# Patient Record
Sex: Female | Born: 1935 | Race: White | Hispanic: No | Marital: Married
Health system: Southern US, Community
[De-identification: ages and names within clinical notes are randomized; demographics above are authoritative.]

---

## 2004-07-29 ENCOUNTER — Ambulatory Visit: Payer: Self-pay | Admitting: Oncology

## 2004-09-04 ENCOUNTER — Ambulatory Visit: Payer: Self-pay | Admitting: Unknown Physician Specialty

## 2005-01-29 ENCOUNTER — Ambulatory Visit: Payer: Self-pay | Admitting: Oncology

## 2005-02-04 ENCOUNTER — Ambulatory Visit: Payer: Self-pay | Admitting: Oncology

## 2005-02-14 ENCOUNTER — Ambulatory Visit: Payer: Self-pay | Admitting: Internal Medicine

## 2005-08-11 ENCOUNTER — Ambulatory Visit: Payer: Self-pay | Admitting: Oncology

## 2005-09-04 ENCOUNTER — Ambulatory Visit: Payer: Self-pay | Admitting: Oncology

## 2006-02-26 ENCOUNTER — Ambulatory Visit: Payer: Self-pay | Admitting: Internal Medicine

## 2006-03-04 ENCOUNTER — Ambulatory Visit: Payer: Self-pay | Admitting: Internal Medicine

## 2006-03-12 ENCOUNTER — Ambulatory Visit: Payer: Self-pay | Admitting: Oncology

## 2006-04-06 ENCOUNTER — Ambulatory Visit: Payer: Self-pay | Admitting: Oncology

## 2006-09-09 ENCOUNTER — Ambulatory Visit: Payer: Self-pay | Admitting: Internal Medicine

## 2006-12-06 ENCOUNTER — Ambulatory Visit: Payer: Self-pay | Admitting: Oncology

## 2006-12-17 ENCOUNTER — Ambulatory Visit: Payer: Self-pay | Admitting: Oncology

## 2007-01-05 ENCOUNTER — Ambulatory Visit: Payer: Self-pay | Admitting: Oncology

## 2007-06-23 ENCOUNTER — Ambulatory Visit: Payer: Self-pay | Admitting: Oncology

## 2007-07-08 ENCOUNTER — Ambulatory Visit: Payer: Self-pay | Admitting: Oncology

## 2007-07-16 ENCOUNTER — Ambulatory Visit: Payer: Self-pay | Admitting: Oncology

## 2007-08-08 ENCOUNTER — Ambulatory Visit: Payer: Self-pay | Admitting: Oncology

## 2007-12-22 ENCOUNTER — Ambulatory Visit: Payer: Self-pay | Admitting: Oncology

## 2008-01-10 ENCOUNTER — Ambulatory Visit: Payer: Self-pay | Admitting: Oncology

## 2008-02-05 ENCOUNTER — Ambulatory Visit: Payer: Self-pay | Admitting: Oncology

## 2008-11-08 ENCOUNTER — Ambulatory Visit: Payer: Self-pay | Admitting: Internal Medicine

## 2008-11-14 ENCOUNTER — Ambulatory Visit: Payer: Self-pay | Admitting: Internal Medicine

## 2008-12-25 ENCOUNTER — Ambulatory Visit: Payer: Self-pay | Admitting: Internal Medicine

## 2010-01-30 ENCOUNTER — Ambulatory Visit: Payer: Self-pay | Admitting: Internal Medicine

## 2010-08-24 ENCOUNTER — Emergency Department: Payer: Self-pay | Admitting: Emergency Medicine

## 2011-02-03 ENCOUNTER — Ambulatory Visit: Payer: Self-pay | Admitting: Internal Medicine

## 2011-09-09 ENCOUNTER — Ambulatory Visit: Payer: Self-pay | Admitting: Internal Medicine

## 2011-09-09 LAB — CREATININE, SERUM: EGFR (Non-African Amer.): >60

## 2012-03-25 ENCOUNTER — Ambulatory Visit: Payer: Self-pay | Admitting: Internal Medicine

## 2012-10-09 ENCOUNTER — Observation Stay: Payer: Self-pay | Admitting: Internal Medicine

## 2012-10-09 LAB — COMPREHENSIVE METABOLIC PANEL
Albumin: 3.6 g/dL (ref 3.4–5.0)
Alkaline Phosphatase: 68 U/L (ref 50–136)
Anion Gap: 6 — ABNORMAL LOW (ref 7–16)
BUN: 10 mg/dL (ref 7–18)
Bilirubin,Total: 0.2 mg/dL (ref 0.2–1.0)
Calcium, Total: 8.6 mg/dL (ref 8.5–10.1)
Chloride: 100 mmol/L (ref 98–107)
Co2: 27 mmol/L (ref 21–32)
EGFR (African American): >60
EGFR (Non-African Amer.): >60
Glucose: 193 mg/dL — ABNORMAL HIGH (ref 65–99)
Osmolality: 271 (ref 275–301)
Potassium: 4 mmol/L (ref 3.5–5.1)
SGPT (ALT): 21 U/L (ref 12–78)

## 2012-10-09 LAB — CBC
HCT: 34.9 % — ABNORMAL LOW (ref 35.0–47.0)
HGB: 11.9 g/dL — ABNORMAL LOW (ref 12.0–16.0)
MCH: 31.3 pg (ref 26.0–34.0)
MCV: 92 fL (ref 80–100)
RBC: 3.81 10*6/uL (ref 3.80–5.20)
WBC: 6.4 10*3/uL (ref 3.6–11.0)

## 2012-10-09 LAB — URINALYSIS, COMPLETE
Bacteria: NONE SEEN
Bilirubin,UR: NEGATIVE
Blood: NEGATIVE
Glucose,UR: NEGATIVE mg/dL (ref 0–75)
Ph: 5 (ref 4.5–8.0)
Protein: NEGATIVE
RBC,UR: <1 /HPF (ref 0–5)
Squamous Epithelial: <1

## 2012-10-09 LAB — PROTIME-INR: Prothrombin Time: 12.9 secs (ref 11.5–14.7)

## 2012-10-09 LAB — CK TOTAL AND CKMB (NOT AT ARMC)
CK, Total: 140 U/L (ref 21–215)
CK-MB: 2.4 ng/mL (ref 0.5–3.6)

## 2012-10-09 LAB — APTT: Activated PTT: 33.7 secs (ref 23.6–35.9)

## 2012-10-10 LAB — CBC WITH DIFFERENTIAL/PLATELET
Basophil %: 0.6 %
Eosinophil #: 0.2 10*3/uL (ref 0.0–0.7)
HCT: 32.4 % — ABNORMAL LOW (ref 35.0–47.0)
HGB: 11.2 g/dL — ABNORMAL LOW (ref 12.0–16.0)
Lymphocyte #: 3.2 10*3/uL (ref 1.0–3.6)
MCH: 31.7 pg (ref 26.0–34.0)
MCHC: 34.7 g/dL (ref 32.0–36.0)
Neutrophil #: 3.9 10*3/uL (ref 1.4–6.5)
RDW: 13.3 % (ref 11.5–14.5)
WBC: 8 10*3/uL (ref 3.6–11.0)

## 2012-10-10 LAB — LIPID PANEL
HDL Cholesterol: 29 mg/dL — ABNORMAL LOW (ref 40–60)
Triglycerides: 514 mg/dL — ABNORMAL HIGH (ref 0–200)

## 2012-10-10 LAB — COMPREHENSIVE METABOLIC PANEL
Alkaline Phosphatase: 49 U/L — ABNORMAL LOW (ref 50–136)
Anion Gap: 8 (ref 7–16)
BUN: 10 mg/dL (ref 7–18)
Bilirubin,Total: 0.3 mg/dL (ref 0.2–1.0)
Chloride: 101 mmol/L (ref 98–107)
EGFR (African American): >60
Glucose: 110 mg/dL — ABNORMAL HIGH (ref 65–99)
Osmolality: 270 (ref 275–301)
SGOT(AST): 22 U/L (ref 15–37)

## 2012-10-10 LAB — URINE CULTURE

## 2012-10-11 LAB — CBC WITH DIFFERENTIAL/PLATELET
Basophil %: 0.4 %
Eosinophil #: 0.3 10*3/uL (ref 0.0–0.7)
Eosinophil %: 3.4 %
HCT: 34.2 % — ABNORMAL LOW (ref 35.0–47.0)
Lymphocyte %: 36 %
MCH: 31.1 pg (ref 26.0–34.0)
MCHC: 33.9 g/dL (ref 32.0–36.0)
MCV: 92 fL (ref 80–100)
Monocyte #: 0.6 x10 3/mm (ref 0.2–0.9)
Monocyte %: 8.1 %
Neutrophil #: 4.1 10*3/uL (ref 1.4–6.5)
Platelet: 205 10*3/uL (ref 150–440)
RBC: 3.73 10*6/uL — ABNORMAL LOW (ref 3.80–5.20)
RDW: 13.1 % (ref 11.5–14.5)
WBC: 7.9 10*3/uL (ref 3.6–11.0)

## 2012-10-11 LAB — BASIC METABOLIC PANEL
Anion Gap: 8 (ref 7–16)
Creatinine: 0.93 mg/dL (ref 0.60–1.30)
EGFR (African American): >60
EGFR (Non-African Amer.): 56 — ABNORMAL LOW
Sodium: 129 mmol/L — ABNORMAL LOW (ref 136–145)

## 2013-07-16 ENCOUNTER — Emergency Department: Payer: Self-pay | Admitting: Emergency Medicine

## 2013-07-17 LAB — COMPREHENSIVE METABOLIC PANEL
ALBUMIN: 3.6 g/dL (ref 3.4–5.0)
AST: 12 U/L — AB (ref 15–37)
Alkaline Phosphatase: 70 U/L
Anion Gap: 4 — ABNORMAL LOW (ref 7–16)
BILIRUBIN TOTAL: 0.2 mg/dL (ref 0.2–1.0)
BUN: 20 mg/dL — ABNORMAL HIGH (ref 7–18)
Calcium, Total: 8.9 mg/dL (ref 8.5–10.1)
Chloride: 98 mmol/L (ref 98–107)
Co2: 27 mmol/L (ref 21–32)
Creatinine: 1.04 mg/dL (ref 0.60–1.30)
EGFR (African American): 57 — ABNORMAL LOW
EGFR (Non-African Amer.): 49 — ABNORMAL LOW
Glucose: 116 mg/dL — ABNORMAL HIGH (ref 65–99)
OSMOLALITY: 263 (ref 275–301)
Potassium: 4.4 mmol/L (ref 3.5–5.1)
SGPT (ALT): 14 U/L (ref 12–78)
Sodium: 129 mmol/L — ABNORMAL LOW (ref 136–145)
TOTAL PROTEIN: 7.7 g/dL (ref 6.4–8.2)

## 2013-07-17 LAB — CBC WITH DIFFERENTIAL/PLATELET
BASOS ABS: 0.1 10*3/uL (ref 0.0–0.1)
Basophil %: 0.7 %
Eosinophil #: 0.5 10*3/uL (ref 0.0–0.7)
Eosinophil %: 6 %
HCT: 35.2 % (ref 35.0–47.0)
HGB: 12.4 g/dL (ref 12.0–16.0)
Lymphocyte #: 2.2 10*3/uL (ref 1.0–3.6)
Lymphocyte %: 27.8 %
MCH: 31.2 pg (ref 26.0–34.0)
MCHC: 35.3 g/dL (ref 32.0–36.0)
MCV: 88 fL (ref 80–100)
Monocyte #: 0.7 x10 3/mm (ref 0.2–0.9)
Monocyte %: 8.3 %
Neutrophil #: 4.6 10*3/uL (ref 1.4–6.5)
Neutrophil %: 57.2 %
PLATELETS: 254 10*3/uL (ref 150–440)
RBC: 3.98 10*6/uL (ref 3.80–5.20)
RDW: 13.8 % (ref 11.5–14.5)
WBC: 8 10*3/uL (ref 3.6–11.0)

## 2013-07-17 LAB — APTT: Activated PTT: 30.8 secs (ref 23.6–35.9)

## 2013-07-17 LAB — PROTIME-INR
INR: 1
PROTHROMBIN TIME: 12.7 s (ref 11.5–14.7)

## 2013-09-27 ENCOUNTER — Ambulatory Visit: Payer: Self-pay | Admitting: Internal Medicine

## 2014-02-22 ENCOUNTER — Inpatient Hospital Stay: Payer: Self-pay | Admitting: Internal Medicine

## 2014-02-22 LAB — URINALYSIS, COMPLETE
BACTERIA: NONE SEEN
Bilirubin,UR: NEGATIVE
Blood: NEGATIVE
GLUCOSE, UR: NEGATIVE mg/dL (ref 0–75)
Ketone: NEGATIVE
LEUKOCYTE ESTERASE: NEGATIVE
NITRITE: NEGATIVE
PROTEIN: NEGATIVE
Ph: 5 (ref 4.5–8.0)
RBC, UR: NONE SEEN /HPF (ref 0–5)
Specific Gravity: 1.013 (ref 1.003–1.030)
Squamous Epithelial: NONE SEEN

## 2014-02-22 LAB — COMPREHENSIVE METABOLIC PANEL
ALT: 12 U/L — AB
AST: 15 U/L (ref 15–37)
Albumin: 3.9 g/dL (ref 3.4–5.0)
Alkaline Phosphatase: 62 U/L
Anion Gap: 9 (ref 7–16)
BILIRUBIN TOTAL: 0.3 mg/dL (ref 0.2–1.0)
BUN: 12 mg/dL (ref 7–18)
CALCIUM: 8.9 mg/dL (ref 8.5–10.1)
CO2: 27 mmol/L (ref 21–32)
Chloride: 97 mmol/L — ABNORMAL LOW (ref 98–107)
Creatinine: 1.21 mg/dL (ref 0.60–1.30)
EGFR (Non-African Amer.): 40 — ABNORMAL LOW
GFR CALC AF AMER: 47 — AB
Glucose: 160 mg/dL — ABNORMAL HIGH (ref 65–99)
OSMOLALITY: 270 (ref 275–301)
Potassium: 4.7 mmol/L (ref 3.5–5.1)
SODIUM: 133 mmol/L — AB (ref 136–145)
TOTAL PROTEIN: 8.1 g/dL (ref 6.4–8.2)

## 2014-02-22 LAB — CBC WITH DIFFERENTIAL/PLATELET
Basophil #: 0.1 10*3/uL (ref 0.0–0.1)
Basophil %: 0.7 %
EOS ABS: 0.7 10*3/uL (ref 0.0–0.7)
Eosinophil %: 9.5 %
HCT: 35.5 % (ref 35.0–47.0)
HGB: 12 g/dL (ref 12.0–16.0)
LYMPHS ABS: 1.1 10*3/uL (ref 1.0–3.6)
Lymphocyte %: 15.6 %
MCH: 30.4 pg (ref 26.0–34.0)
MCHC: 33.9 g/dL (ref 32.0–36.0)
MCV: 90 fL (ref 80–100)
MONOS PCT: 4.8 %
Monocyte #: 0.3 x10 3/mm (ref 0.2–0.9)
NEUTROS ABS: 4.8 10*3/uL (ref 1.4–6.5)
Neutrophil %: 69.4 %
PLATELETS: 223 10*3/uL (ref 150–440)
RBC: 3.96 10*6/uL (ref 3.80–5.20)
RDW: 13.6 % (ref 11.5–14.5)
WBC: 6.9 10*3/uL (ref 3.6–11.0)

## 2014-02-22 LAB — TROPONIN I: Troponin-I: <0.02 ng/mL

## 2014-02-22 LAB — LIPASE, BLOOD: Lipase: 98 U/L (ref 73–393)

## 2014-02-23 LAB — LIPID PANEL
CHOLESTEROL: 159 mg/dL (ref 0–200)
HDL: 28 mg/dL — AB (ref 40–60)
Ldl Cholesterol, Calc: 74 mg/dL (ref 0–100)
Triglycerides: 286 mg/dL — ABNORMAL HIGH (ref 0–200)
VLDL Cholesterol, Calc: 57 mg/dL — ABNORMAL HIGH (ref 5–40)

## 2014-02-24 LAB — BASIC METABOLIC PANEL
ANION GAP: 10 (ref 7–16)
BUN: 17 mg/dL (ref 7–18)
CALCIUM: 8.6 mg/dL (ref 8.5–10.1)
CO2: 26 mmol/L (ref 21–32)
Chloride: 97 mmol/L — ABNORMAL LOW (ref 98–107)
Creatinine: 1.23 mg/dL (ref 0.60–1.30)
GFR CALC AF AMER: 46 — AB
GFR CALC NON AF AMER: 40 — AB
GLUCOSE: 145 mg/dL — AB (ref 65–99)
Osmolality: 271 (ref 275–301)
Potassium: 3.6 mmol/L (ref 3.5–5.1)
Sodium: 133 mmol/L — ABNORMAL LOW (ref 136–145)

## 2014-02-24 LAB — CBC WITH DIFFERENTIAL/PLATELET
Basophil #: 0 10*3/uL (ref 0.0–0.1)
Basophil %: 0.5 %
Eosinophil #: 0.6 10*3/uL (ref 0.0–0.7)
Eosinophil %: 8.2 %
HCT: 33.4 % — ABNORMAL LOW (ref 35.0–47.0)
HGB: 11.6 g/dL — ABNORMAL LOW (ref 12.0–16.0)
LYMPHS PCT: 24.7 %
Lymphocyte #: 1.9 10*3/uL (ref 1.0–3.6)
MCH: 30.7 pg (ref 26.0–34.0)
MCHC: 34.6 g/dL (ref 32.0–36.0)
MCV: 89 fL (ref 80–100)
MONOS PCT: 6.4 %
Monocyte #: 0.5 x10 3/mm (ref 0.2–0.9)
NEUTROS ABS: 4.6 10*3/uL (ref 1.4–6.5)
Neutrophil %: 60.2 %
Platelet: 231 10*3/uL (ref 150–440)
RBC: 3.76 10*6/uL — ABNORMAL LOW (ref 3.80–5.20)
RDW: 13.7 % (ref 11.5–14.5)
WBC: 7.6 10*3/uL (ref 3.6–11.0)

## 2014-02-25 LAB — CBC WITH DIFFERENTIAL/PLATELET
Basophil #: 0 10*3/uL (ref 0.0–0.1)
Basophil %: 0.5 %
Eosinophil #: 0.8 10*3/uL — ABNORMAL HIGH (ref 0.0–0.7)
Eosinophil %: 13.2 %
HCT: 30.5 % — AB (ref 35.0–47.0)
HGB: 10.2 g/dL — AB (ref 12.0–16.0)
LYMPHS ABS: 2 10*3/uL (ref 1.0–3.6)
Lymphocyte %: 33.7 %
MCH: 30.1 pg (ref 26.0–34.0)
MCHC: 33.6 g/dL (ref 32.0–36.0)
MCV: 90 fL (ref 80–100)
Monocyte #: 0.4 x10 3/mm (ref 0.2–0.9)
Monocyte %: 7 %
NEUTROS ABS: 2.7 10*3/uL (ref 1.4–6.5)
Neutrophil %: 45.6 %
PLATELETS: 178 10*3/uL (ref 150–440)
RBC: 3.4 10*6/uL — AB (ref 3.80–5.20)
RDW: 13.6 % (ref 11.5–14.5)
WBC: 5.9 10*3/uL (ref 3.6–11.0)

## 2014-02-25 LAB — BASIC METABOLIC PANEL
ANION GAP: 9 (ref 7–16)
BUN: 13 mg/dL (ref 7–18)
CHLORIDE: 103 mmol/L (ref 98–107)
Calcium, Total: 8.2 mg/dL — ABNORMAL LOW (ref 8.5–10.1)
Co2: 24 mmol/L (ref 21–32)
Creatinine: 0.94 mg/dL (ref 0.60–1.30)
EGFR (African American): >60
EGFR (Non-African Amer.): 55 — ABNORMAL LOW
Glucose: 93 mg/dL (ref 65–99)
Osmolality: 272 (ref 275–301)
Potassium: 3.9 mmol/L (ref 3.5–5.1)
Sodium: 136 mmol/L (ref 136–145)

## 2014-02-26 LAB — CBC WITH DIFFERENTIAL/PLATELET
Basophil #: 0 10*3/uL (ref 0.0–0.1)
Basophil %: 0.4 %
EOS ABS: 1 10*3/uL — AB (ref 0.0–0.7)
Eosinophil %: 15 %
HCT: 30.7 % — AB (ref 35.0–47.0)
HGB: 10.5 g/dL — ABNORMAL LOW (ref 12.0–16.0)
LYMPHS PCT: 26.2 %
Lymphocyte #: 1.7 10*3/uL (ref 1.0–3.6)
MCH: 30.7 pg (ref 26.0–34.0)
MCHC: 34.1 g/dL (ref 32.0–36.0)
MCV: 90 fL (ref 80–100)
Monocyte #: 0.4 x10 3/mm (ref 0.2–0.9)
Monocyte %: 6.4 %
NEUTROS PCT: 52 %
Neutrophil #: 3.4 10*3/uL (ref 1.4–6.5)
Platelet: 193 10*3/uL (ref 150–440)
RBC: 3.41 10*6/uL — ABNORMAL LOW (ref 3.80–5.20)
RDW: 14.2 % (ref 11.5–14.5)
WBC: 6.5 10*3/uL (ref 3.6–11.0)

## 2014-02-26 LAB — BASIC METABOLIC PANEL
ANION GAP: 10 (ref 7–16)
BUN: 12 mg/dL (ref 7–18)
CALCIUM: 8 mg/dL — AB (ref 8.5–10.1)
Chloride: 107 mmol/L (ref 98–107)
Co2: 23 mmol/L (ref 21–32)
Creatinine: 1.04 mg/dL (ref 0.60–1.30)
EGFR (African American): 56 — ABNORMAL LOW
GFR CALC NON AF AMER: 49 — AB
Glucose: 85 mg/dL (ref 65–99)
Osmolality: 278 (ref 275–301)
POTASSIUM: 3.9 mmol/L (ref 3.5–5.1)
SODIUM: 140 mmol/L (ref 136–145)

## 2014-02-27 LAB — CBC WITH DIFFERENTIAL/PLATELET
Basophil #: 0 10*3/uL (ref 0.0–0.1)
Basophil %: 0.6 %
EOS ABS: 0.9 10*3/uL — AB (ref 0.0–0.7)
EOS PCT: 13.6 %
HCT: 29.6 % — ABNORMAL LOW (ref 35.0–47.0)
HGB: 9.9 g/dL — AB (ref 12.0–16.0)
Lymphocyte #: 1.9 10*3/uL (ref 1.0–3.6)
Lymphocyte %: 29.2 %
MCH: 30.3 pg (ref 26.0–34.0)
MCHC: 33.6 g/dL (ref 32.0–36.0)
MCV: 90 fL (ref 80–100)
MONO ABS: 0.4 x10 3/mm (ref 0.2–0.9)
MONOS PCT: 5.5 %
NEUTROS ABS: 3.3 10*3/uL (ref 1.4–6.5)
Neutrophil %: 51.1 %
Platelet: 185 10*3/uL (ref 150–440)
RBC: 3.28 10*6/uL — ABNORMAL LOW (ref 3.80–5.20)
RDW: 13.7 % (ref 11.5–14.5)
WBC: 6.4 10*3/uL (ref 3.6–11.0)

## 2014-02-27 LAB — BASIC METABOLIC PANEL
ANION GAP: 10 (ref 7–16)
BUN: 10 mg/dL (ref 7–18)
CHLORIDE: 107 mmol/L (ref 98–107)
CO2: 23 mmol/L (ref 21–32)
Calcium, Total: 8.3 mg/dL — ABNORMAL LOW (ref 8.5–10.1)
Creatinine: 0.89 mg/dL (ref 0.60–1.30)
GFR CALC NON AF AMER: 59 — AB
GLUCOSE: 79 mg/dL (ref 65–99)
Osmolality: 277 (ref 275–301)
POTASSIUM: 3.6 mmol/L (ref 3.5–5.1)
SODIUM: 140 mmol/L (ref 136–145)

## 2014-09-25 ENCOUNTER — Inpatient Hospital Stay: Payer: Self-pay | Admitting: Internal Medicine

## 2014-09-28 LAB — BASIC METABOLIC PANEL
Anion Gap: 3 — ABNORMAL LOW (ref 7–16)
BUN: 12 mg/dL
CHLORIDE: 107 mmol/L
Calcium, Total: 8.1 mg/dL — ABNORMAL LOW
Co2: 24 mmol/L
Creatinine: 0.92 mg/dL
EGFR (African American): >60
GFR CALC NON AF AMER: 56 — AB
Glucose: 141 mg/dL — ABNORMAL HIGH
POTASSIUM: 4.9 mmol/L
SODIUM: 134 mmol/L — AB

## 2014-09-28 LAB — CBC WITH DIFFERENTIAL/PLATELET
BASOS ABS: 0 10*3/uL (ref 0.0–0.1)
Basophil %: 0.5 %
Eosinophil #: 0.4 10*3/uL (ref 0.0–0.7)
Eosinophil %: 7.3 %
HCT: 29.1 % — AB (ref 35.0–47.0)
HGB: 9.5 g/dL — AB (ref 12.0–16.0)
LYMPHS ABS: 2.3 10*3/uL (ref 1.0–3.6)
Lymphocyte %: 40.5 %
MCH: 29.6 pg (ref 26.0–34.0)
MCHC: 32.7 g/dL (ref 32.0–36.0)
MCV: 90 fL (ref 80–100)
MONO ABS: 0.4 x10 3/mm (ref 0.2–0.9)
Monocyte %: 6.9 %
Neutrophil #: 2.6 10*3/uL (ref 1.4–6.5)
Neutrophil %: 44.8 %
PLATELETS: 215 10*3/uL (ref 150–440)
RBC: 3.22 10*6/uL — ABNORMAL LOW (ref 3.80–5.20)
RDW: 13.9 % (ref 11.5–14.5)
WBC: 5.7 10*3/uL (ref 3.6–11.0)

## 2014-10-20 ENCOUNTER — Emergency Department
Admit: 2014-10-20 | Disposition: A | Discharge status: Home / Self Care | Payer: Self-pay | Admitting: Emergency Medicine

## 2014-10-20 LAB — CBC WITH DIFFERENTIAL/PLATELET
Basophil #: 0 10*3/uL (ref 0.0–0.1)
Basophil %: 0.4 %
EOS PCT: 6.7 %
Eosinophil #: 0.4 10*3/uL (ref 0.0–0.7)
HCT: 32.4 % — ABNORMAL LOW (ref 35.0–47.0)
HGB: 10.8 g/dL — ABNORMAL LOW (ref 12.0–16.0)
Lymphocyte #: 1 10*3/uL (ref 1.0–3.6)
Lymphocyte %: 16.8 %
MCH: 30.3 pg (ref 26.0–34.0)
MCHC: 33.5 g/dL (ref 32.0–36.0)
MCV: 91 fL (ref 80–100)
MONO ABS: 0.5 x10 3/mm (ref 0.2–0.9)
MONOS PCT: 7.7 %
Neutrophil #: 4 10*3/uL (ref 1.4–6.5)
Neutrophil %: 68.4 %
PLATELETS: 208 10*3/uL (ref 150–440)
RBC: 3.58 10*6/uL — ABNORMAL LOW (ref 3.80–5.20)
RDW: 14 % (ref 11.5–14.5)
WBC: 5.9 10*3/uL (ref 3.6–11.0)

## 2014-10-20 LAB — BASIC METABOLIC PANEL
ANION GAP: 6 — AB (ref 7–16)
BUN: 19 mg/dL
CREATININE: 0.91 mg/dL
Calcium, Total: 8.7 mg/dL — ABNORMAL LOW
Chloride: 101 mmol/L
Co2: 28 mmol/L
EGFR (African American): >60
GFR CALC NON AF AMER: 57 — AB
GLUCOSE: 189 mg/dL — AB
POTASSIUM: 4.5 mmol/L
SODIUM: 135 mmol/L

## 2014-10-20 LAB — URINALYSIS, COMPLETE
BILIRUBIN, UR: NEGATIVE
Blood: NEGATIVE
Glucose,UR: 50 mg/dL (ref 0–75)
KETONE: NEGATIVE
LEUKOCYTE ESTERASE: NEGATIVE
Nitrite: NEGATIVE
PH: 6 (ref 4.5–8.0)
Protein: NEGATIVE
Specific Gravity: 1.016 (ref 1.003–1.030)
Squamous Epithelial: NONE SEEN

## 2014-10-20 LAB — TROPONIN I: Troponin-I: <0.03 ng/mL

## 2014-10-23 ENCOUNTER — Emergency Department: Admit: 2014-10-23 | Disposition: A | Discharge status: Home / Self Care | Payer: Self-pay | Admitting: Student

## 2014-10-23 LAB — URINALYSIS, COMPLETE
BILIRUBIN, UR: NEGATIVE
BLOOD: NEGATIVE
Bacteria: NONE SEEN
Glucose,UR: NEGATIVE mg/dL (ref 0–75)
Ketone: NEGATIVE
Leukocyte Esterase: NEGATIVE
Nitrite: NEGATIVE
PH: 5 (ref 4.5–8.0)
Protein: NEGATIVE
RBC,UR: NONE SEEN /HPF (ref 0–5)
Specific Gravity: 1.015 (ref 1.003–1.030)

## 2014-10-23 LAB — CBC WITH DIFFERENTIAL/PLATELET
BASOS ABS: 0 10*3/uL (ref 0.0–0.1)
Basophil %: 0.5 %
Eosinophil #: 0.5 10*3/uL (ref 0.0–0.7)
Eosinophil %: 7.5 %
HCT: 34.6 % — ABNORMAL LOW (ref 35.0–47.0)
HGB: 11.7 g/dL — AB (ref 12.0–16.0)
LYMPHS ABS: 2 10*3/uL (ref 1.0–3.6)
Lymphocyte %: 28.2 %
MCH: 30.5 pg (ref 26.0–34.0)
MCHC: 33.9 g/dL (ref 32.0–36.0)
MCV: 90 fL (ref 80–100)
MONO ABS: 0.5 x10 3/mm (ref 0.2–0.9)
Monocyte %: 6.7 %
Neutrophil #: 4.1 10*3/uL (ref 1.4–6.5)
Neutrophil %: 57.1 %
Platelet: 230 10*3/uL (ref 150–440)
RBC: 3.83 10*6/uL (ref 3.80–5.20)
RDW: 13.8 % (ref 11.5–14.5)
WBC: 7.1 10*3/uL (ref 3.6–11.0)

## 2014-10-23 LAB — COMPREHENSIVE METABOLIC PANEL
ALK PHOS: 45 U/L
ANION GAP: 8 (ref 7–16)
Albumin: 4 g/dL
BUN: 12 mg/dL
Bilirubin,Total: 0.3 mg/dL
CALCIUM: 8.9 mg/dL
Chloride: 99 mmol/L — ABNORMAL LOW
Co2: 25 mmol/L
Creatinine: 0.9 mg/dL
EGFR (Non-African Amer.): 58 — ABNORMAL LOW
GLUCOSE: 162 mg/dL — AB
POTASSIUM: 4 mmol/L
SGOT(AST): 20 U/L
SGPT (ALT): 13 U/L — ABNORMAL LOW
Sodium: 132 mmol/L — ABNORMAL LOW
Total Protein: 7.4 g/dL

## 2014-10-23 LAB — TROPONIN I: Troponin-I: <0.03 ng/mL

## 2014-10-27 NOTE — H&P (Signed)
DATE OF BIRTH:  1926/03/02  DATE OF ADMISSION:  10/09/2012  PRIMARY CARE PHYSICIAN:  Dr. Marcello Fennel  CHIEF COMPLAINT:  "I couldn't talk right."   HISTORY OF PRESENT ILLNESS:  This is an 81 year old female who presents to the ER feeling that she could not talk right. She could not say the words she wanted to say. They were all mixed up. She felt a little dizzy. It happened all of a sudden today, and lasted for a few hours. Now seems better to her. Also, the left side of her face felt odd. Hospitalist services were contacted for further evaluation for TIA-type symptoms. The patient does not complain of any neurological deficits or weakness one side versus the other, just difficulty with the speech.   PAST MEDICAL HISTORY:  History of colon cancer, status post resection, history of breast cancer, hypothyroidism, osteoporosis, hypertension, hyperlipidemia, diabetes, peripheral neuropathy, history of TIA.    PAST SURGICAL HISTORY:  Colon cancer resection, breast surgery, appendectomy.   ALLERGIES:  In the computer as COUMADIN, FLAGYL, PENICILLIN.   MEDICATIONS: The patient takes aspirin 81 mg daily, Zantac 75 mg daily, calcium and vitamin D daily, Allegra 180 mg daily, alendronate 70 mg weekly, lisinopril/hydrochlorothiazide 20/12.5, 1 tablet daily, lovastatin 20 mg daily, levothyroxine 25 mcg daily, glyburide 5 mg daily. This is as per [facility] record.   SOCIAL HISTORY:  No smoking. No alcohol. No drug use. Granddaughter lives with her.   FAMILY HISTORY:  The patient is unable to remember. Looking back at old chart, unable to get from there also.   REVIEW OF SYSTEMS: CONSTITUTIONAL:  No fever, chills or sweats. Positive for weight gain. No weakness one side versus the other.  EYES: She does wear glasses.  EARS, NOSE, MOUTH AND THROAT:  Positive for runny nose. No difficulty swallowing.  CARDIOVASCULAR:  No chest pain. No palpitations.  RESPIRATORY:  No shortness of breath. Positive for  cough. No sputum. No hemoptysis.  GASTROINTESTINAL: No nausea. No vomiting. No abdominal pain. No diarrhea. No constipation. No bright red blood per rectum. No melena.  GENITOURINARY:  No burning on urination. No hematuria.  MUSCULOSKELETAL: Occasional pain underneath the left rib. Some leg pain occasionally.  INTEGUMENT:  No rashes or eruptions.  NEUROLOGIC:  No fainting or blackouts. Difficulty with speech today.  PSYCHIATRIC:  No anxiety or depression.  ENDOCRINE:  Positive for hypothyroidism. HEMATOLOGIC AND LYMPHATIC:  History of anemia.   PHYSICAL EXAMINATION: VITAL SIGNS: Temperature 98.3, pulse 60, respirations 20, blood pressure 123/58, pulse ox 99% on room air.  GENERAL:  No respiratory distress.  EYES:  Conjunctivae and lids normal. Pupils equal, round and reactive to light. Extraocular muscles intact. No nystagmus.  EARS, NOSE, MOUTH AND THROAT:  Tympanic membranes: No erythema. Nasal mucosa: No erythema. Throat:  No erythema, no exudate seen. Lips and gums:  No lesions.  NECK: No JVD. No bruits. No lymphadenopathy. No thyromegaly. No thyroid nodules palpated.  RESPIRATORY:  Lungs clear to auscultation. No use of accessory muscles to breathe. No rhonchi, rales or wheeze heard.  CARDIOVASCULAR SYSTEM: S1, S2 normal. A 2/6 systolic ejection murmur. Carotid upstroke 2+ bilaterally. Dorsalis pedis pulses 2+ bilaterally. Trace edema of the lower extremity.  ABDOMEN:  Soft, nontender. No organomegaly/splenomegaly. Normoactive bowel sounds. No masses felt.  LYMPHATIC:  No lymph nodes in the neck.  MUSCULOSKELETAL: Trace edema. No clubbing. No cyanosis.  SKIN:  No rashes or ulcers seen.  NEUROLOGIC:  Cranial nerves II through XII grossly intact. Deep tendon reflexes 2+  bilateral lower extremities. Babinski negative. Power 5 out of 5 upper and lower extremities. Sensation intact to light touch.  PSYCHIATRIC:  The patient is oriented to person, place and time.   LABORATORY AND RADIOLOGICAL  DATA:   PT, INR, PTT:  Normal range. CT scan of the head:  No acute evidence of ischemia or hemorrhagic infarction. Age-related atrophic changes present. Basal ganglia calcifications bilaterally. Soft tissue density mass in the posterior aspect of the nasal septum on the left. Correlation with ENT evaluation. White blood cell count 6.4, H and H 11.9 and 34.9, platelet count of 223. Glucose 193, BUN 10, creatinine 0.78, sodium 133, potassium 4.0, chloride 100, CO2 of 27, calcium 8.6. Liver function tests:  Normal range. Troponin negative. EKG:  Sinus rhythm with arrhythmia, PVC, 70 beats per minute.   ASSESSMENT AND PLAN: 1. Transient ischemic attack, with trouble getting her words out correctly. No urinalysis was sent. I will send off a urinalysis and a urine culture. I will get an MRI of the brain, echocardiogram and carotid ultrasound. Will do a step-up in treatment to Plavix for stroke prevention. Patient does take aspirin at home. Will check a lipid profile. Patient is currently on lovastatin.   2. Hypertension. Blood pressure is stable on current lisinopril/hydrochlorothiazide. Will continue at this point.   3.  Hyperlipidemia, on lovastatin. Check a lipid profile.   4.  Diabetes, on glyburide. Will also put on sliding scale.   5.  Hypothyroidism. Continue low dose levothyroxine.   6.  Soft tissue density in the left nasal septum. Recommend ENT outpatient appointment.   7.  Osteoporosis. Continue calcium and vitamin D and alendronate.   Time spent on admission:  55 minutes.   The patient is a DNR.    ____________________________ Herschell Dimesichard J. Renae GlossWieting, MD rjw:mr D: 10/09/2012 17:46:37 ET T: 10/09/2012 18:19:02 ET JOB#: [id number]  cc: Herschell Dimesichard J. Renae GlossWieting, MD, <Dictator> Barbette ReichmannVishwanath Hande, MD  Salley ScarletICHARD J Kamran Coker MD ELECTRONICALLY SIGNED 10/11/2012 15:16

## 2014-10-27 NOTE — Discharge Summary (Signed)
PATIENT NAME:  Bailey Henry, Bailey Henry MR#:  [id number] DATE OF BIRTH:  12-Feb-1926  DATE OF ADMISSION:  10/09/2012 DATE OF DISCHARGE:  10/11/2012  DISCHARGE DIAGNOSES:   1.  Transient ischemic attack.  2.  Hypertension.  3.  Hyperlipidemia.  4.  Type 2 diabetes.  5.  Hypothyroidism.  6.  MRI evidence for soft tissue swelling in the posterior left nasopharynx.   CHIEF COMPLAINT: "I couldn't talk right."   HISTORY OF PRESENT ILLNESS: The patient is an 81 year old female with a history of hypothyroidism, osteoporosis, hypertension, hyperlipidemia, type 2 diabetes, peripheral neuropathy, history of previous TIA, history of colon cancer status post resection, history of breast cancer, who presented to the ER with speech disturbance and numbness of the left face. The patient stated that she could not talk right and could not state the words that she wanted to say and she felt mixed up. She also felt dizzy and the left side of the face felt odd.   PAST SURGICAL HISTORY: Significant for colon resection, breast surgery and appendectomy.   PHYSICAL EXAMINATION:  VITAL SIGNS: Temperature is 98.3, pulse 60, respirations 20, blood pressure 122/58, pulse oximetry 99% on room air.  GENERAL: She was not in distress.  HEENT: Pupils equal and reactive to light.  NECK: No JVD.  LUNGS: Clear to auscultation.  HEART: S1, S2, II/VI systolic ejection murmur.  ABDOMEN: Soft, nontender.  EXTREMITIES: No edema.  NEUROLOGIC: Alert and oriented. Deep tendon reflexes intact. Plantars are bilaterally downgoing. Power was 5/5 in upper and lower extremities. Sensation was intact.   LABORATORY, DIAGNOSTIC AND RADIOLOGICAL DATA: CT head did not show any evidence of ischemia or hemorrhagic infarction. WBC count 6.4, hemoglobin 11.9, hematocrit 34.9, platelets 223. Glucose 193, BUN 10, creatinine 0.78, sodium 133, potassium 4.0, chloride 100, CO2 of 27, calcium 8.6. Troponin was negative. EKG showed sinus arrhythmia, PVCs, 70 beats  per minute. MRI of the brain: No evidence of acute ischemia. There is chronic white matter ischemia. Soft tissue swelling was noted in the posterior left nasopharynx, and direct visualization was suggested.   HOSPITAL COURSE: During her stay in hospital, the patient's speech returned to baseline and although she appeared weak, she was able to ambulate, and she was also started on Plavix. An ENT appointment was made for as an outpatient. She was discharged in stable condition.   DISCHARGE MEDICATIONS: Clopidogrel 75 mg once a day, lovastatin 20 mg once a day, glyburide 5 mg once a day, levothyroxine 25 mcg once a day, alendronate 70 mg weekly, lisinopril/HCT 20/12.5 one tablet a day and Klor-Con 20 mEq once a day.   DISCHARGE INSTRUCTIONS: Home health nursing was also arranged for her. She was advised to see ENT specialist and outpatient followup with me, Dr. Marcello FennelHande, in a few days' time. The patient has been requested to call us back if there are any questions or concerns. She was stable at the time of discharge.   TIME SPENT: Total time spent in discharging this patient: 35 minutes.   ____________________________ Barbette ReichmannVishwanath Liborio Saccente, MD vh:jm D: 10/14/2012 13:41:24 ET T: 10/14/2012 14:21:54 ET JOB#: [id number]  cc: Barbette ReichmannVishwanath Lonn Im, MD, <Dictator> Barbette ReichmannVISHWANATH Yoltzin Ransom MD ELECTRONICALLY SIGNED 10/17/2012 13:42

## 2014-10-28 NOTE — Discharge Summary (Signed)
PATIENT NAME:  Bailey GreaserFOSTER, Bailey Henry MR#:  [id number] DATE OF BIRTH:  April 17, 1926  DATE OF ADMISSION:  02/22/2014  DATE OF DISCHARGE: 02/27/2014    DIAGNOSES AT TIME OF DISCHARGE:  1.  Slurring of speech and weakness and difficulty with ambulation, possible transient ischemic attack.  2.  Hypothyroidism.  3.  Dementia.  4.  Hyperlipidemia.  5.  Hypertension.   CHIEF COMPLAINT: Slurred speech, weakness, difficulty in ambulating.   HISTORY OF PRESENT ILLNESS:  Bailey MiyamotoOla Berenson is an 81 year old female with a history of TIAs, hypothyroidism, dementia, hyperlipidemia, presented to the ED complaining of slurred speech, weakness, and difficulty in ambulating.  The patient reportedly had become confused and could not put her clothes, as she normally is able, and her grandson had help her to the car.  She was also experiencing significant weakness with difficulty in ambulation.  Please see H and P for full details.   HOSPITAL COURSE: The patient was admitted to [facility] and underwent an MRI of the brain which showed chronic changes. No acute intracranial findings.  There was evidence of a possible nasal polyp in the left division sphenoid sinus.  CT head did not show any acute abnormality. Chest x-ray did not show any acute findings.  EKG showed accelerated junctional rhythm with occasional PVCs, and low voltage complexes and nonspecific ST-T changes.  The patient was also noted to have low sodium 133 that gradually resolved with IV hydration. Initial hemoglobin was 11.6, hematocrit 33.4, platelet count 231. During her stay in the hospital, the patient also received physical therapy and it was recommended that she go to rehab for further physical therapy.  The patient was discharged in stable condition on the following medications.  DISCHARGE MEDICATIONS:  Levothyroxine 25 mcg once a day, glyburide 5 mg once a day, lovastatin 20 mg once a day, Clopidrogel 75 mg once a day, Zoloft 25 mg once at bedtime, clorazepate 3.75 mg one  at bedtime, lisinopril 20 mg once a day, Exelon patch 4.6 mg per 24 hours, 1 patch transdermal once a day and aspirin 81 mg once a day.   The patient was advised a low-sodium diet and to also undergo physical therapy and follow with me, Dr. Marcello FennelHande in 2 to 3 weeks' time. She was stable at the time of discharge.   TOTAL TIME SPENT ON DISCHARGING PATIENT: 35 minutes.     ____________________________ Barbette ReichmannVishwanath Dezarai Prew, MD vh:DT D: 02/27/2014 13:06:09 ET T: 02/27/2014 13:36:57 ET JOB#: [id number]  cc: Barbette ReichmannVishwanath Natosha Bou, MD, <Dictator> Barbette ReichmannVISHWANATH Liba Hulsey MD ELECTRONICALLY SIGNED 03/15/2014 13:48

## 2014-10-28 NOTE — H&P (Signed)
PATIENT NAME:  Bailey Henry, Bailey Henry MR#:  [id number] DATE OF BIRTH:  Jul 09, 1925  DATE OF ADMISSION:  02/22/2014  PRIMARY CARE PHYSICIAN: Barbette ReichmannVishwanath Hande, MD.  CHIEF COMPLAINT: Slurred speech, weakness and difficulty ambulating.   HISTORY OF PRESENT ILLNESS: This is a very-pleasant 81 year old female with a history of TIAs, hypothyroidism, dementia, hyperlipidemia who presents with the above complaint. Upon awakening this morning, the family noted her to have slurred speech. She could not put on her clothes which normally she is able to in the morning. Her face felt very weak and she had difficulty ambulating. Her grandson had to help her to the car. Here in the Emergency Room, they actually tried to ambulate her but she had much difficulty. As per family, she has been in her usual state of health.   REVIEW OF SYSTEMS:   CONSTITUTIONAL: No fever, fatigue, or weakness. EYES: No glaucoma.  EAR, NOSE AND THROAT: Positive hearing loss. No postnasal drip or snoring.  RESPIRATORY: No cough, wheezing, hemoptysis or COPD.  CARDIOVASCULAR: No chest pain, orthopnea, edema, arrhythmia, dyspnea on exertion, palpitations, syncope.  GASTROINTESTINAL: No nausea, vomiting, diarrhea, abdominal pain, melena, or ulcers.  GENITOURINARY: No dysuria or hematuria.  ENDOCRINE: No polyuria or polydipsia. HEMATOLOGIC/LYMPHATIC: Positive anemia. SKIN: No rash or lesions. MUSCULOSKELETAL: No gout or swelling.  NEUROLOGIC: Positive history of TIA.  PSYCHIATRIC: She has dementia.   PAST MEDICAL HISTORY: 1. TIA.  2. Dementia.  3. Hyperlipidemia.  4. Hypothyroidism.  5. GERD.  6. Diabetes.  7. Hypertension.  8. Breast cancer. 9. Colon cancer.   SURGICAL HISTORY:  1. Colectomy. 2. Appendectomy.   MEDICATIONS: 1. Zoloft 25 mg daily.  2. Lovastatin 20 mg daily. 3. Synthroid 25 mcg daily. 4.  K-DUR 20 mEq daily.  5. Hydrochlorothiazide/lisinopril 12.5/20 daily. 6. Glyburide 5 mg daily. 7. Plavix 75 mg daily.  8.  Alendronate 70 mg weekly.  9. Aspirin 81 mg daily.  10. Exelon patch.    ALLERGIES: FLAGYL CAUSES HIVES AND PENICILLIN CAUSES A RASH.  SOCIAL HISTORY: No tobacco, alcohol, or drug use.   FAMILY HISTORY: Unable to recall.   PHYSICAL EXAMINATION: VITAL SIGNS: Temperature 99.5, pulse 74, respirations 16, blood pressure 93/67, 100% on room air.  GENERAL: The patient is alert and oriented to name. With her dementia, she is not really oriented to time but she is oriented to place.  HEENT: Head is atraumatic. Pupils are reactive. Sclerae anicteric. Mucous membranes are moist. Oropharynx: Clear.  NECK: Supple without JVD or enlarged thyroid, or carotid bruit.  CARDIOVASCULAR: Regular rate and rhythm. No murmurs, gallops, or rubs. PMI is nondisplaced.  LUNGS: Clear to auscultation without crackles, rales, rhonchi, or wheezing. Normal to percussion. Normal chest expansion.  ABDOMEN: Bowel sounds are positive. Nontender, nondistended. No hepatosplenomegaly.  EXTREMITIES: No clubbing cyanosis or edema.  NEUROLOGIC: Cranial nerves II through XII are intact. There are no focal deficits. Upon ambulation, she had difficulty ambulating. Her reflexes are 2+ bilaterally symmetrically.   PERTINENT LABORATORY: Troponin less than 0.02. White blood cells 6.9, hemoglobin 12, hematocrit 35, platelets are 223, sodium 133, potassium 4.7, chloride 97, bicarbonate 27, BUN 12, creatinine 1.2, glucose 160, alkaline phosphatase normal ALT 12, AST 15, total protein 8.1, albumin 3.9, calcium 8.9. Lipase is 98. CT of the head showed no acute intracranial hemorrhage.   Chest x-ray shows no acute abnormality.   EKG: Normal sinus rhythm. No ST elevation or depression.   ASSESSMENT AND PLAN: An 81 year old female who woke up this morning with slurred speech,  confusion, difficulty ambulating, possibly had a stroke.  1. Cerebrovascular accident: The patient was here at about a year ago. At that time, she had carotid Dopplers and  echocardiogram both of which were unremarkable. I would not repeat these tests. I have ordered another MRI to evaluate for cerebrovascular accident. I will consult speech and physical therapy as she still has symptoms of a stroke.  2. Neuro checks will be performed every 4 hours. We will continue her on aspirin and Plavix, as well as statin medication.  3. Dementia. The patient has Exelon patch, which we will continue.  4. Hypothyroidism: We will continue her levothyroxine.  History of diabetes. We will continue her outpatient medications. The patient is now on sliding-scale insulin, ADA diet.   The patient has a DO NOT RESUSCITATE status.   TIME SPENT: Approximately 45 minutes.    ____________________________ Janyth Contes. Juliene Pina, MD spm:NT D: 02/22/2014 16:41:29 ET T: 02/22/2014 17:15:42 ET JOB#: [id number]  cc: Jaylnn Ullery P. Juliene Pina, MD, <Dictator> Janyth Contes Inari Shin MD ELECTRONICALLY SIGNED 02/22/2014 19:25

## 2014-11-05 NOTE — H&P (Signed)
PATIENT NAME:  Bailey Henry, HASKEW MR#:  [id number] DATE OF BIRTH:  June 29, 1926  DATE OF ADMISSION:  09/25/2014  PRIMARY CARE PHYSICIAN:  Barbette Reichmann, MD  CHIEF COMPLAINT:  Confusion, weakness, hypoglycemia.   HISTORY OF PRESENT ILLNESS:  This is an 81 year old Caucasian female patient with history of hypothyroidism, dementia, TIAs, and diabetes mellitus type 2, who presents to the Emergency Room, brought in by the family in a private vehicle with hypoglycemia. The patient was seen earlier by EMS after they were called for confusion. Her blood sugars were found to be in the 30s. She was given an amp of D50 and felt better, but again, the patient had some confusion and weakness and was brought to the Emergency Room by family and her blood sugar was found to be 31. Here, she was given an amp of D50, but blood sugar, which increased to 110, is already trending down to 86. The patient has had poor p.o. intake. The patient was on glyburide 5 mg daily at home, which she has been off since last Monday, when she ran out of her prescription.   Her blood sugars are not checked at home according to the daughter.   At baseline, she ambulates with a walker, but has been unable to get out of bed due to weakness today.   PAST MEDICAL HISTORY:  TIA, dementia, hyperlipidemia, hypothyroidism, GERD, diabetes, hypertension, breast cancer, colon cancer.   PAST SURGICAL HISTORY:  Colectomy and appendectomy.   REVIEW OF SYSTEMS: CONSTITUTIONAL:  Complains of fatigue and weakness.  EYES:  No blurred vision, pain, or redness.  EARS, NOSE, AND THROAT:  No tinnitus, ear pain, or hearing loss.  RESPIRATORY:  No cough, wheeze, or hemoptysis.  CARDIOVASCULAR:  No chest pain, orthopnea, or edema.  GASTROINTESTINAL:  No nausea, vomiting, diarrhea, or abdominal pain.  GENITOURINARY:  No dysuria, hematuria, or frequency. ENDOCRINE:  No polyuria, nocturia, or thyroid problems.  HEMATOLOGIC AND LYMPHATIC:  No anemia or easy  bruising or bleeding.  INTEGUMENTARY:  No acne, rash, or edema.  MUSCULOSKELETAL:  Has arthritis.  NEUROLOGIC:  Has history of TIA.  PSYCHIATRIC:  Has dementia.   ALLERGIES:  FLAGYL AND PENICILLIN.   SOCIAL HISTORY:  The patient does not smoke. No alcohol. No illicit drug use. Lives at home with her daughter. Ambulates with a walker.   CODE STATUS:  DO NOT RESUSCITATE/DO NOT INTUBATE.   FAMILY HISTORY:  Reviewed, but the patient is unable to recall.   HOME MEDICATIONS: 1.  Glyburide 5 mg daily, but the patient has not taken this medication for the past week.  2.  Levothyroxine 50 mcg daily.  3.  Albuterol 2 puffs inhaled 4 times a day as needed.  4.  Aspirin 81 mg daily.  5.  Calcium with vitamin D 1 tablet daily.  6.  Plavix 75 mg daily.  7.  Clorazepate 3.75 mg orally once a day.  8.  Exelon 4.6 mg transdermally once a day.  9.  Levaquin 5 mg orally daily for bronchitis.  10.  Levothyroxine 50 mcg daily.  11.  Lisinopril 20 mg daily.  12.  Loratadine 10 mg daily.  13.  Lovastatin 20 mg daily.  14.  Potassium chloride 10 mEq daily.  15.  Vantin 150 mg orally 2 times a day.  16.  Vitamin B12, 1 capsule a day.  17.  Zoloft 25 mg daily.  18.  Zolpidem 5 mg daily at bedtime.   PHYSICAL EXAMINATION: VITAL SIGNS:  Temperature 98.2,  pulse 64, respirations 20, blood pressure 119/60, saturating 98% on room air.  GENERAL:  Elderly Caucasian female patient lying in bed, mildly confused.  PSYCHIATRIC:  Drowsy.  HEENT:  Atraumatic, normocephalic. Oral mucosa is  dry and pink. Pallor positive. No icterus.  NECK:  Supple. No thyromegaly. No palpable lymph nodes.  CARDIOVASCULAR:  S1 and S2. No murmurs. Peripheral pulses are 2+. No edema.  RESPIRATORY:  Normal work of breathing. Clear to auscultation on both sides.  GASTROINTESTINAL:  Soft abdomen, nontender. Bowel sounds present.  SKIN:  Warm and dry. No petechiae or rash.  MUSCULOSKELETAL:  No joint swelling, redness, or effusion.   NEUROLOGICAL:  Motor strength is minus 5 over 5 all over. Babinski is downgoing.   LABORATORY STUDIES:  Show glucose of 37, BUN 18, creatinine 1.35, sodium 126, potassium 4, and chloride of 90. AST, ALT, alkaline phosphatase, and bilirubin were normal. Troponin was 0.03. WBC was 8000, hemoglobin 11.5, and platelets were 276,000.   Urinalysis showed no bacteria and less than 1 WBC.   Chest x-ray, portable, showed mild left basilar atelectasis, nothing acute; scoliosis, which is chronic.   ASSESSMENT AND PLAN: 1.  Hypoglycemia in a patient with a history of diabetes mellitus type 2. At this point, etiology of her hypoglycemia is unclear, but the patient's blood sugar on presentation was 31. She did receive an amp of D50 in the Emergency Room earlier and also D50 earlier in the morning by EMS. Blood sugar is already dropping down to 66. We will give her another amp of D50 and put her on D5 normal saline with KCl. The patient will be on Accu-Cheks q. 2 hours. We will put her on a regular diet and encourage her to increase her food intake. The patient's acute encephalopathy or dementia is secondary to hypoglycemia. Etiology is unclear, but we will check HbA1c and hold her glyburide. No insulin sliding scale. Check TSH, cortisol levels, and insulin C-peptide levels. Further management is as per her glucose trend and lab results.  2.  Hyponatremia. This is due to dehydration. The patient will be on D5 normal saline. We will repeat in the morning.  3.  Acute sinusitis/bronchitis. The patient is presently on treatment with Levaquin per her PCP. We will continue the Levaquin. She does not have any wheezing. She does complain of some sinus congestion.  4.  Hypertension. Continue medications.  5.  Deep vein thrombosis prophylaxis with Lovenox.   CODE STATUS:  DO NOT RESUSCITATE/DO NOT INTUBATE, as confirmed with her daughter at bedside.   CRITICAL CARE TIME SPENT:  40  minutes.   ____________________________ Molinda BailiffSrikar R. Rossie Bretado, MD srs:nb D: 09/25/2014 03:12:54 ET T: 09/25/2014 03:44:31 ET JOB#: [id number]  cc: Wardell HeathSrikar R. Geralynn Capri, MD, <Dictator> Barbette ReichmannVishwanath Hande, MD Orie FishermanSRIKAR R Socorro Ebron MD ELECTRONICALLY SIGNED 09/26/2014 2:24

## 2014-11-05 NOTE — Discharge Summary (Signed)
PATIENT NAME:  Bailey Henry, Bailey Henry DATE OF BIRTH:  1926/02/25  DATE OF ADMISSION:  09/25/2014 DATE OF DISCHARGE:  09/28/2014  DIAGNOSES AT THE TIME OF DISCHARGE:   1.  Altered mental status with encephalopathy secondary to Hypoglycemia.  2.  Hyponatremia secondary to dehydration.  3.  Acute sinusitis.  4.  Hypertension.  5.  Dementia and confusion.   CHIEF COMPLAINT: Confusion, weakness, hypoglycemia.   HISTORY OF PRESENT ILLNESS: Bailey Henry is an 81 year old female with a history of hypothyroidism, dementia, TIAs, type 2 diabetes who presented to the ED, brought by family because of blood sugars that were in the 30s. The patient was given D50 and a repeat blood sugar was still low at 31 and subsequently went up to 110 of another amp of D50. The patient reportedly had been confused and her p.o. intake had been poor.  Her glyburide had been discontinued approximately a week back and the patient had not checked her blood sugars. At baseline the patient normally ambulates with a walker but had become weak and had difficulty in ambulation.   PAST MEDICAL HISTORY: Significant for TIA, dementia, hyperlipidemia, hypothyroidism, GERD, diabetes, hypertension, breast cancer, and colon cancer. Please see H and P for full details.   HOSPITAL COURSE: During her stay in the hospital, the patient did have pain in the right shoulder for which she received Tylenol and also tramadol. Her blood sugars gradually improved and serum sodium level also improved to 134. She was seen by physical therapy. X-ray of the right shoulder was also done, which did not show any acute abnormalities. There was evidence of degenerative changes.   DISCHARGE INSTRUCTIONS: The patient was discharged to rehab on the following medications: Levaquin 250 mg p.o. q. daily for 5 days. levothyroxine 50 mcg once a day, lisinopril 20 mg once a day, Zoloft 25 mg once a day, ranitidine 150 mg p.o. q. 12, KCl 10 mEq once a day, lovastatin  20 mg once a day, Exelon patch 4.6 mg topical q. daily, aspirin 81 mg a day, Plavix 75 mg a day,, clorazepate 3.75 mg at bedtime, docusate sodium 100 mg p.o. b.i.d., zolpidem 5 mg p.o. at bedtime p.r.n., and tramadol 50 mg p.o. b.i.d. p.r.n.    The patient is stable at the time of discharge. She has been advised to follow up with me, Dr. Marcello FennelHande, in 1 to 2 weeks' time.    Addendum: Pt is being discharged today. No change in discharge instructions  TOTAL TIME SPENT ON DISCHARGING THE PATIENT: 35 minutes.    ____________________________ Barbette ReichmannVishwanath Tanda Morrissey, MD vh:AT D: 09/28/2014 18:04:00 ET T: 09/28/2014 06:08:52 ET JOB#: [id number]  cc: Barbette ReichmannVishwanath Arturo Freundlich, MD, <Dictator> Barbette ReichmannVISHWANATH Tashala Cumbo MD ELECTRONICALLY SIGNED 10/02/2014 18:11

## 2014-12-06 DEATH — deceased

## 2015-01-29 LAB — BASIC METABOLIC PANEL
ANION GAP: 6 — AB (ref 7–16)
BUN: 9 mg/dL
Calcium, Total: 8.2 mg/dL — ABNORMAL LOW
Chloride: 108 mmol/L
Co2: 24 mmol/L
Creatinine: 0.99 mg/dL
EGFR (African American): 60 — ABNORMAL LOW
EGFR (Non-African Amer.): 52 — ABNORMAL LOW
GLUCOSE: 149 mg/dL — AB
POTASSIUM: 4.8 mmol/L
Sodium: 138 mmol/L

## 2015-01-29 LAB — CBC WITH DIFFERENTIAL/PLATELET
BASOS ABS: 0 10*3/uL (ref 0.0–0.1)
Basophil %: 0.7 %
EOS ABS: 0.5 10*3/uL (ref 0.0–0.7)
EOS PCT: 8.2 %
HCT: 29.4 % — ABNORMAL LOW (ref 35.0–47.0)
HGB: 9.8 g/dL — AB (ref 12.0–16.0)
Lymphocyte #: 1.9 10*3/uL (ref 1.0–3.6)
Lymphocyte %: 34.8 %
MCH: 30 pg (ref 26.0–34.0)
MCHC: 33.4 g/dL (ref 32.0–36.0)
MCV: 90 fL (ref 80–100)
Monocyte #: 0.4 x10 3/mm (ref 0.2–0.9)
Monocyte %: 7.2 %
Neutrophil #: 2.7 10*3/uL (ref 1.4–6.5)
Neutrophil %: 49.1 %
Platelet: 225 10*3/uL (ref 150–440)
RBC: 3.26 10*6/uL — AB (ref 3.80–5.20)
RDW: 13.9 % (ref 11.5–14.5)
WBC: 5.6 10*3/uL (ref 3.6–11.0)

## 2016-08-14 IMAGING — CR DG CHEST 1V PORT
1 series · 1 of 1 positions shown · non-contrast
Comparison: Chest radiograph performed 02/22/2014

CLINICAL DATA: Acute onset of shortness of breath and cough.
Hypoglycemia. Initial encounter.

EXAM:
PORTABLE CHEST - 1 VIEW

[ap]
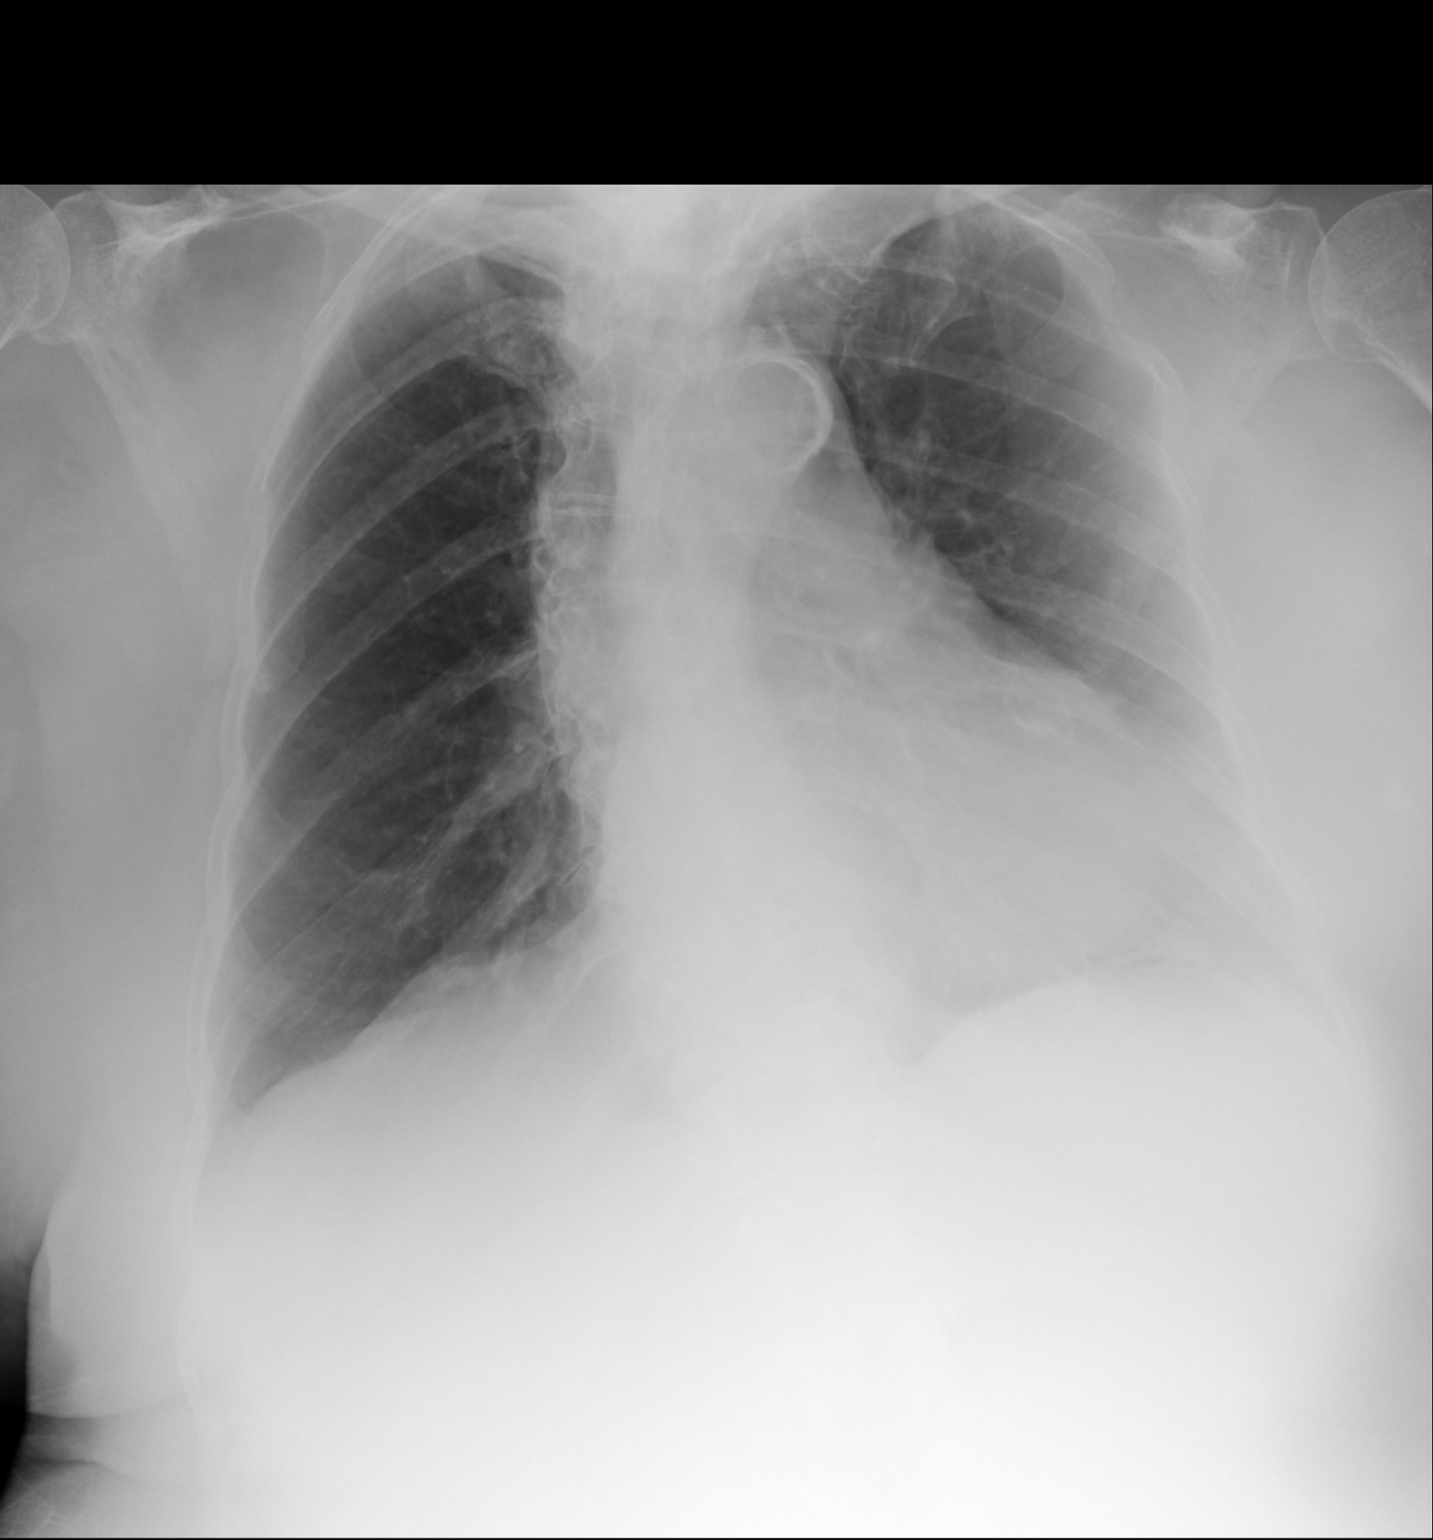

[1 of 1 positions shown; findings below may reference images not displayed]

FINDINGS: The lungs are well-aerated. Minimal left basilar atelectasis is
noted. There is no evidence of focal opacification, pleural effusion
or pneumothorax.

The cardiomediastinal silhouette is borderline enlarged. No acute
osseous abnormalities are seen. Mild right convex thoracic scoliosis
is noted.
IMPRESSION: 1. Minimal left basilar atelectasis noted.  Borderline cardiomegaly.
2. Mild right convex thoracic scoliosis seen.

## 2016-08-17 IMAGING — CR DG SHOULDER 1V*R*
1 series · 1 of 1 positions shown · non-contrast
Comparison: 09/24/2014

CLINICAL DATA: Anterior right shoulder pain.

EXAM:
RIGHT SHOULDER - 1 VIEW

[ap]
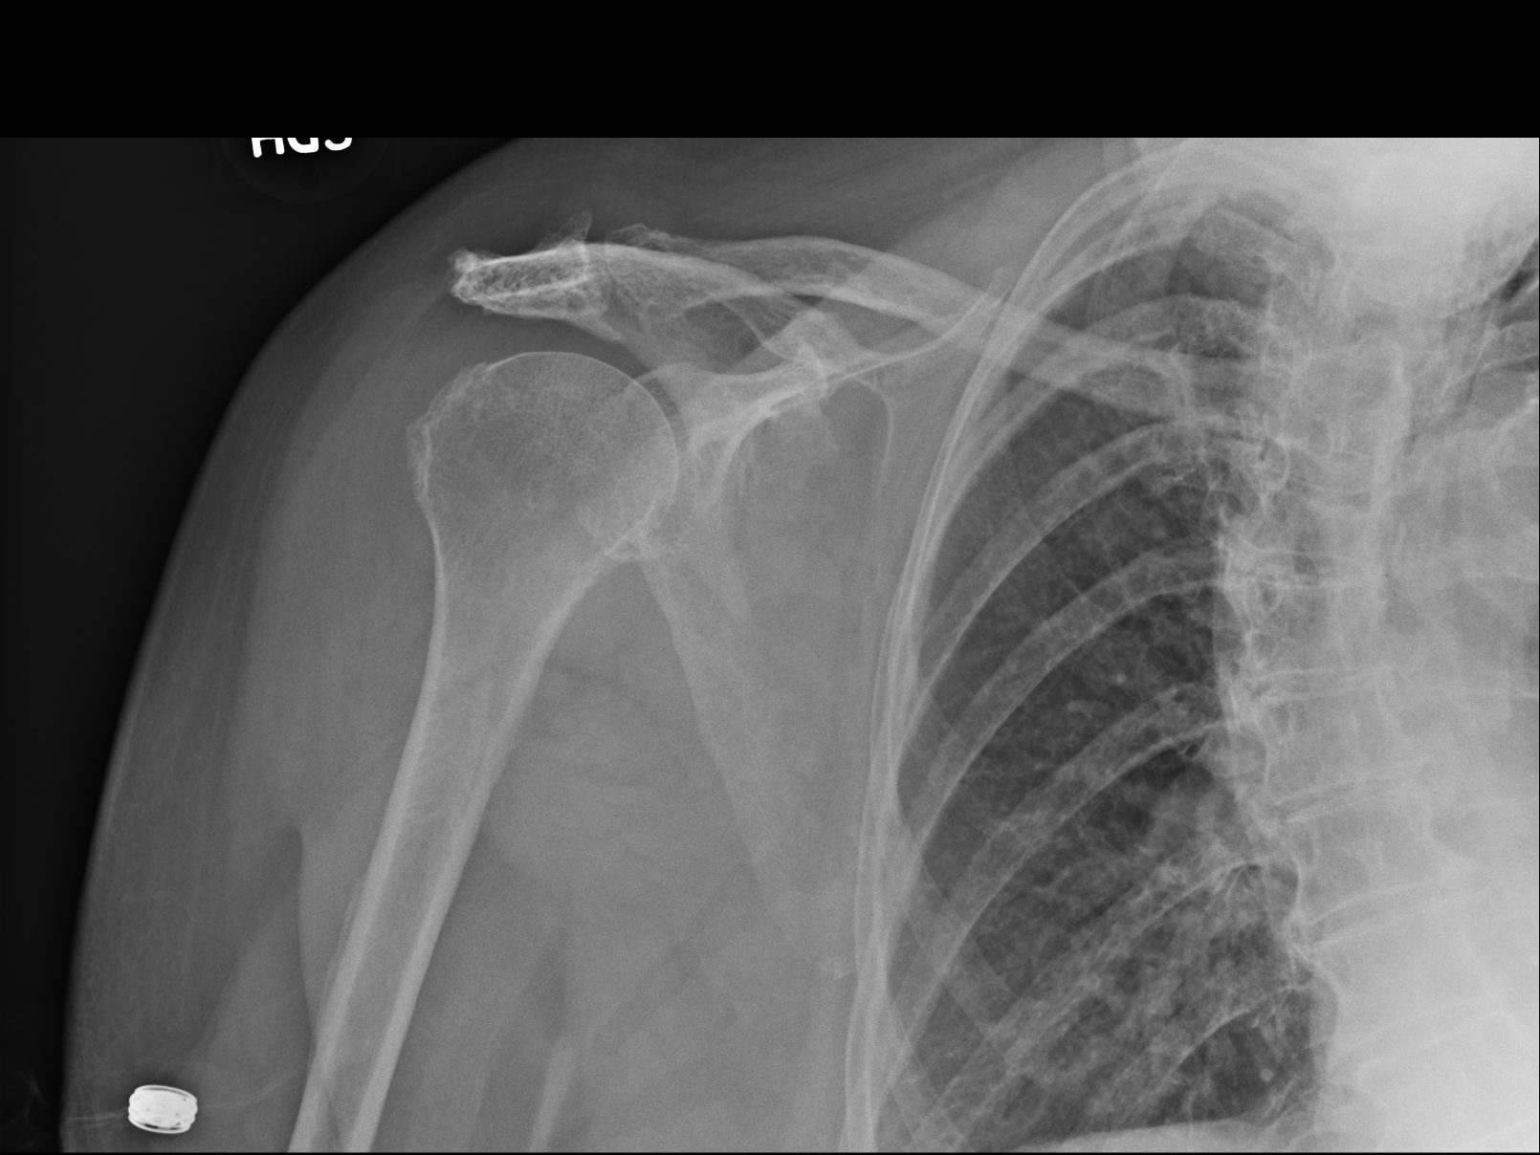

[1 of 1 positions shown; findings below may reference images not displayed]

FINDINGS: Degenerative changes in the right AC and glenohumeral joints. Large
spurs including subacromial spurs off the right AC joint. No
fracture, subluxation or dislocation.
IMPRESSION: Limited study with single view. No acute bony abnormality.
Degenerative changes as above, most pronounced in the right AC
joint.

## 2016-09-09 IMAGING — CR DG CHEST 2V
1 series · 2 of 2 positions shown · non-contrast
Comparison: 09/24/2014

CLINICAL DATA: Patient has had a productive cough but was unable to
specify when coughing began. Patient had chest xray performed at
doctors visit yesterday. Patient states that xray showed "white
spots" on her lungs. Patient has a HX of stroke and dementia.

EXAM:
CHEST  2 VIEW

[Series 1: dxr chest pa (or ap) and lateral · 0.14mm/px · 2 of 2 slices shown]
[im 1/2]
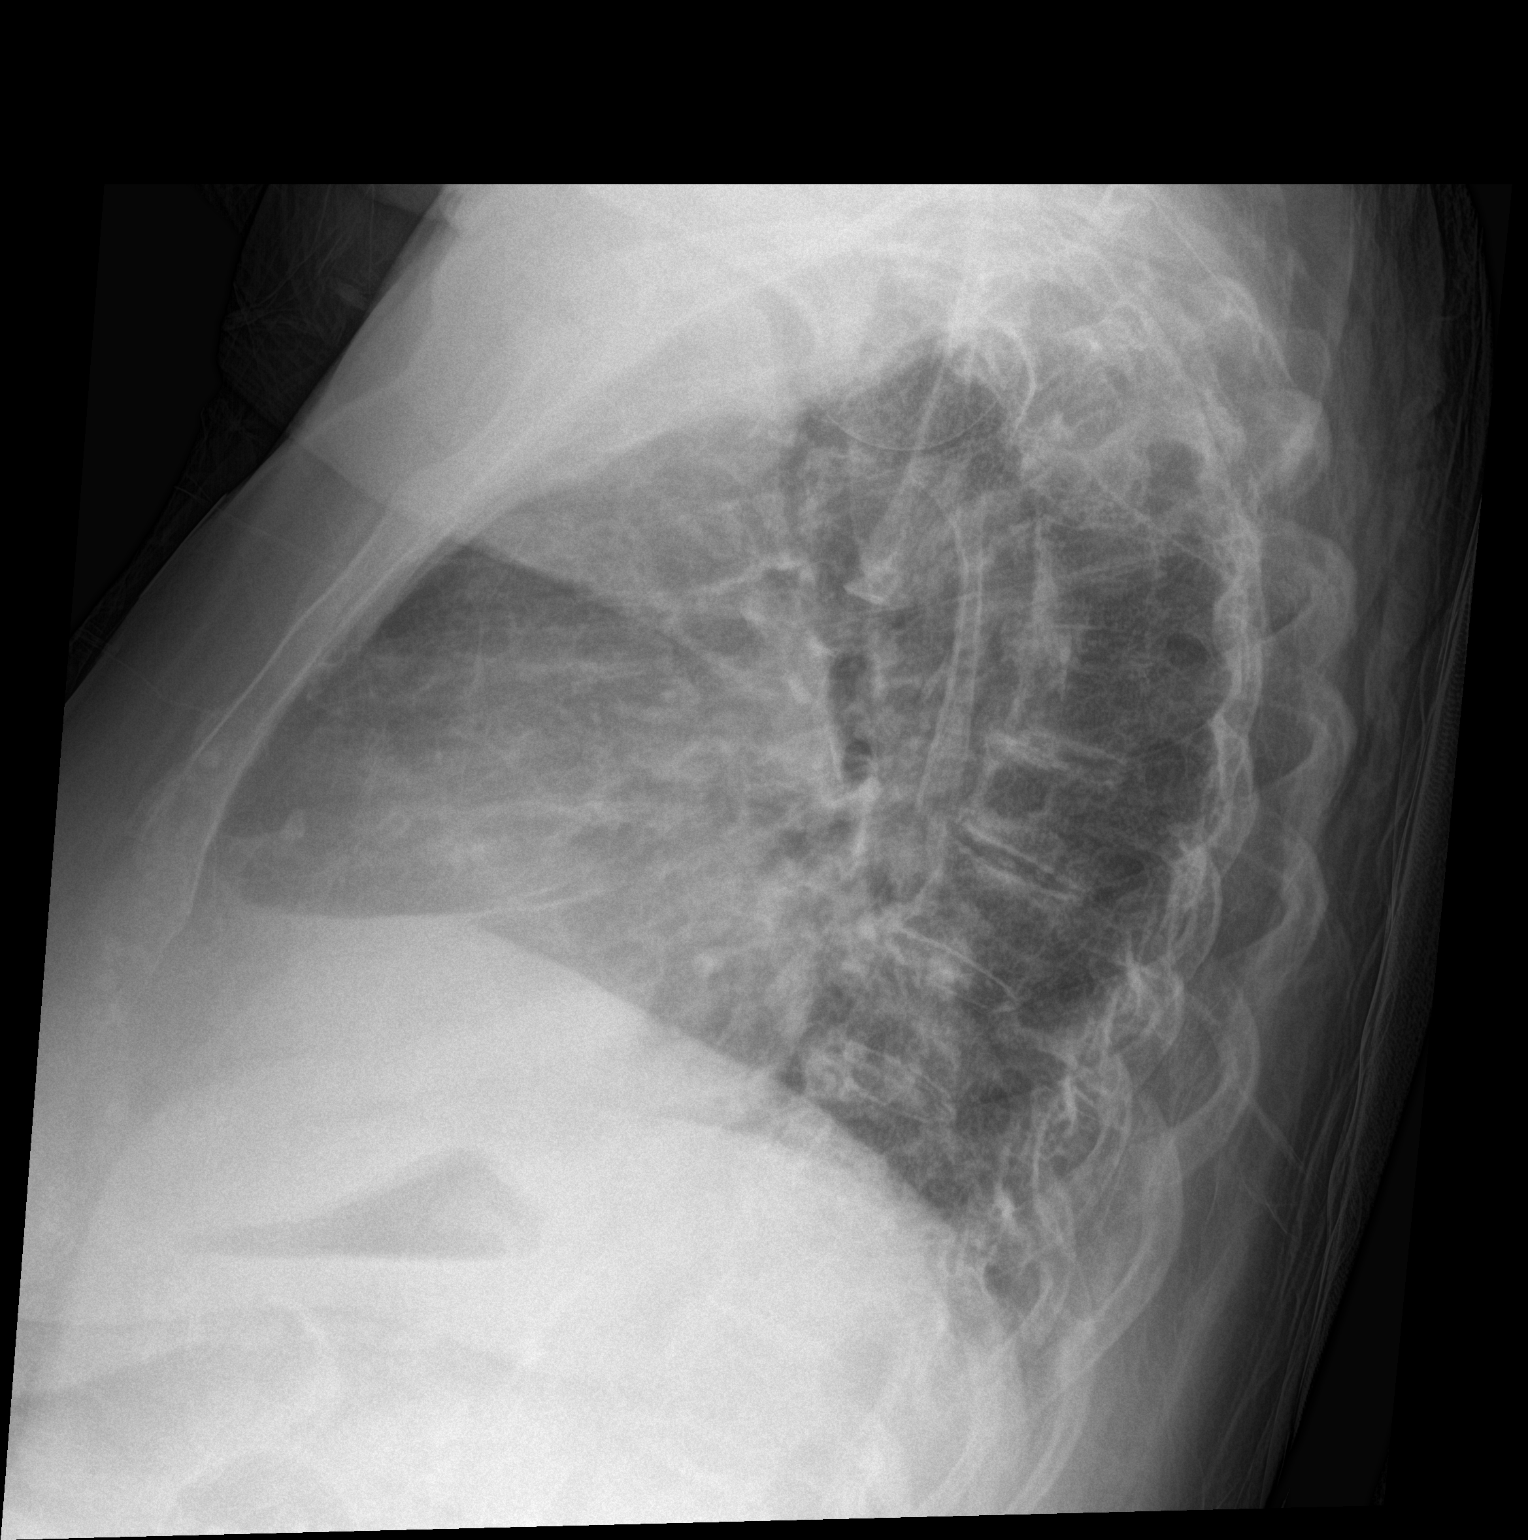
[im 2/2]
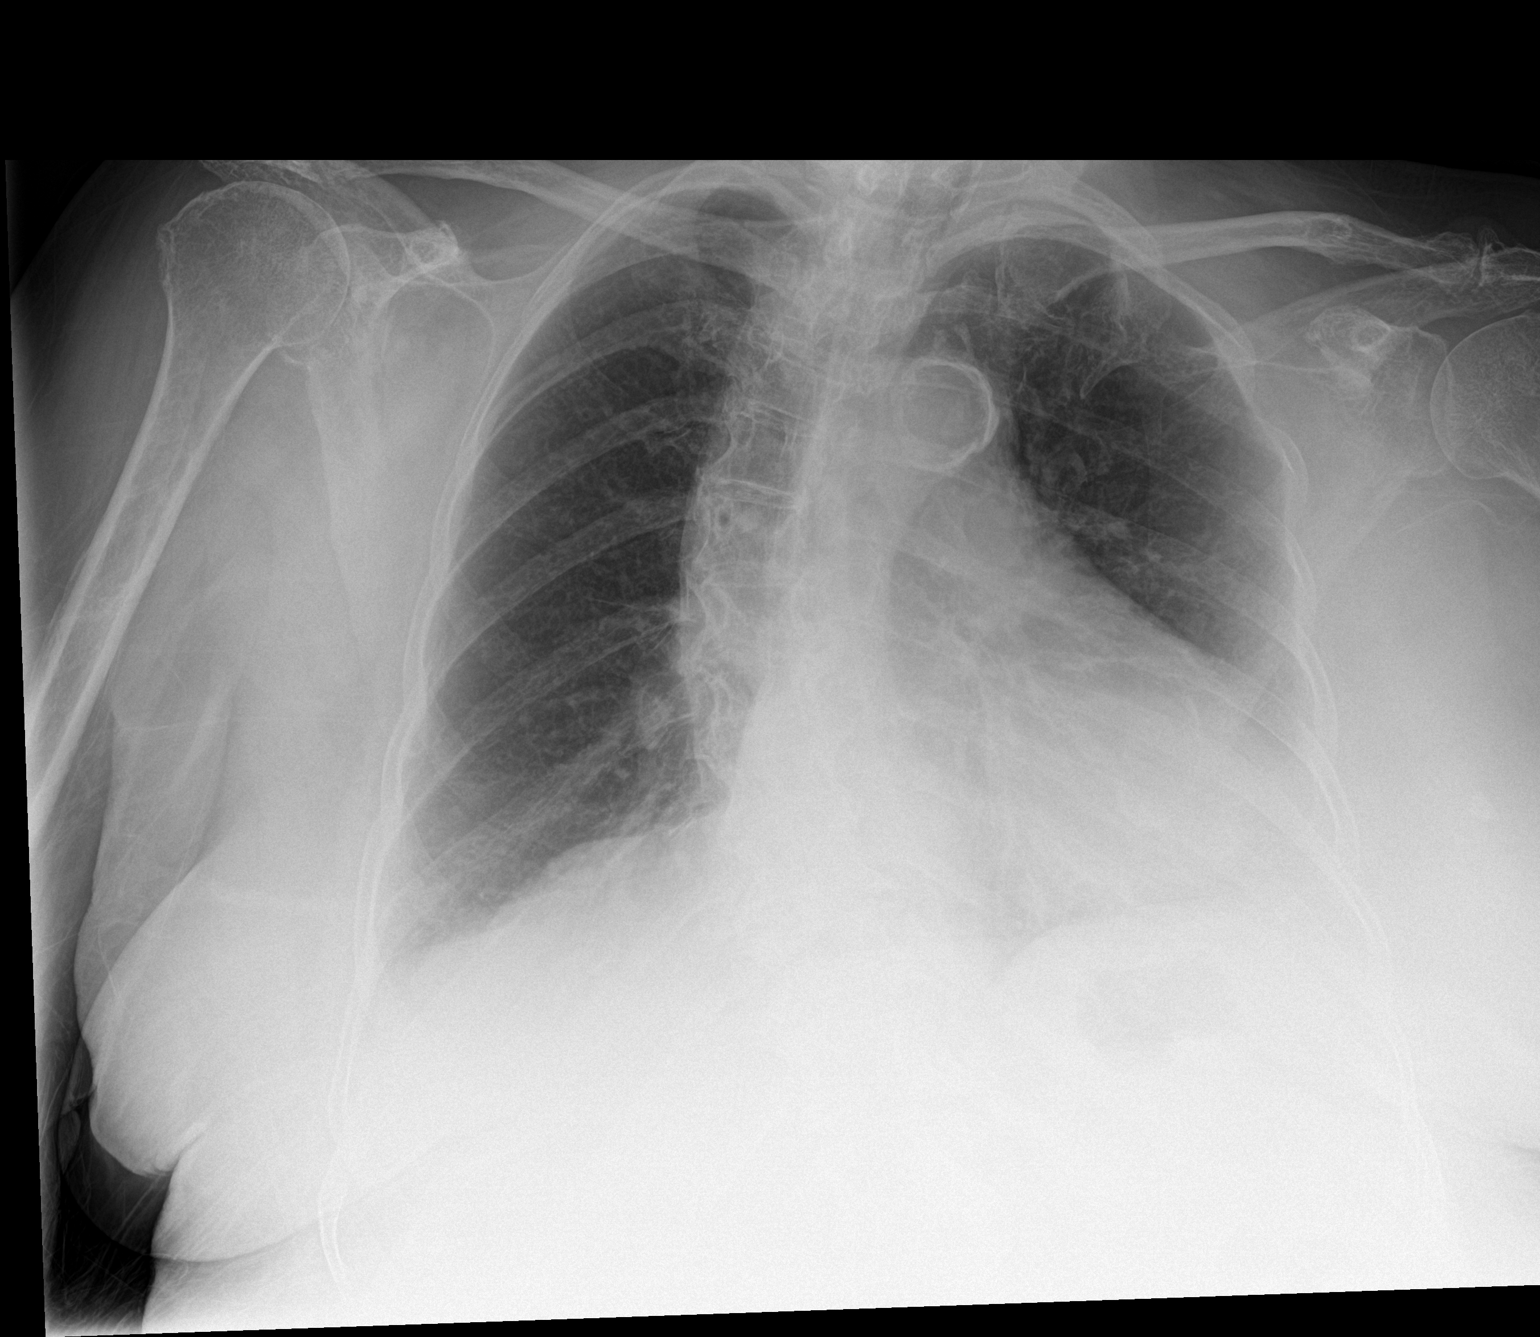

[2 of 2 positions shown; findings below may reference images not displayed]

FINDINGS: Cardiac silhouette is top-normal in size. No mediastinal or hilar
masses or evidence of adenopathy. Lungs are clear. No pleural
effusion or pneumothorax.

Bony thorax is demineralized. Stable dextroscoliosis of the thoracic
spine.
IMPRESSION: No active cardiopulmonary disease.
